# Patient Record
Sex: Male | Born: 1985 | Hispanic: Yes | Marital: Single | Smoking: Never smoker
Health system: Southern US, Community
[De-identification: ages and names within clinical notes are randomized; demographics above are authoritative.]

## PROBLEM LIST (undated history)

## (undated) DIAGNOSIS — R51 Headache: Secondary | ICD-10-CM

## (undated) DIAGNOSIS — R519 Headache, unspecified: Secondary | ICD-10-CM

---

## 2016-11-04 ENCOUNTER — Emergency Department: Payer: Self-pay

## 2016-11-04 ENCOUNTER — Emergency Department
Admission: EM | Admit: 2016-11-04 | Discharge: 2016-11-04 | Disposition: A | Discharge status: Home / Self Care | Payer: Self-pay | Attending: Emergency Medicine | Admitting: Emergency Medicine

## 2016-11-04 ENCOUNTER — Encounter: Payer: Self-pay | Admitting: Emergency Medicine

## 2016-11-04 DIAGNOSIS — R0602 Shortness of breath: Secondary | ICD-10-CM | POA: Insufficient documentation

## 2016-11-04 DIAGNOSIS — R202 Paresthesia of skin: Secondary | ICD-10-CM | POA: Insufficient documentation

## 2016-11-04 DIAGNOSIS — R1030 Lower abdominal pain, unspecified: Secondary | ICD-10-CM | POA: Insufficient documentation

## 2016-11-04 DIAGNOSIS — M545 Low back pain, unspecified: Secondary | ICD-10-CM

## 2016-11-04 DIAGNOSIS — G8929 Other chronic pain: Secondary | ICD-10-CM

## 2016-11-04 DIAGNOSIS — R42 Dizziness and giddiness: Secondary | ICD-10-CM | POA: Insufficient documentation

## 2016-11-04 HISTORY — DX: Headache, unspecified: R51.9

## 2016-11-04 HISTORY — DX: Headache: R51

## 2016-11-04 LAB — CBC WITH DIFFERENTIAL/PLATELET
BASOS ABS: 0 10*3/uL (ref 0–0.1)
Basophils Relative: 0 %
EOS PCT: 2 %
Eosinophils Absolute: 0.1 10*3/uL (ref 0–0.7)
HEMATOCRIT: 42.8 % (ref 40.0–52.0)
HEMOGLOBIN: 14.6 g/dL (ref 13.0–18.0)
LYMPHS ABS: 1.7 10*3/uL (ref 1.0–3.6)
LYMPHS PCT: 22 %
MCH: 33.1 pg (ref 26.0–34.0)
MCHC: 34.2 g/dL (ref 32.0–36.0)
MCV: 96.8 fL (ref 80.0–100.0)
MONO ABS: 0.5 10*3/uL (ref 0.2–1.0)
Monocytes Relative: 7 %
Neutro Abs: 5.3 10*3/uL (ref 1.4–6.5)
Neutrophils Relative %: 69 %
Platelets: 296 10*3/uL (ref 150–440)
RBC: 4.42 MIL/uL (ref 4.40–5.90)
RDW: 16.3 % — AB (ref 11.5–14.5)
WBC: 7.7 10*3/uL (ref 3.8–10.6)

## 2016-11-04 LAB — BASIC METABOLIC PANEL
Anion gap: 8 (ref 5–15)
BUN: 24 mg/dL — AB (ref 6–20)
CHLORIDE: 106 mmol/L (ref 101–111)
CO2: 28 mmol/L (ref 22–32)
Calcium: 9.8 mg/dL (ref 8.9–10.3)
Creatinine, Ser: 0.8 mg/dL (ref 0.61–1.24)
GFR calc Af Amer: >60 mL/min (ref >60)
GFR calc non Af Amer: >60 mL/min (ref >60)
GLUCOSE: 103 mg/dL — AB (ref 65–99)
POTASSIUM: 4.2 mmol/L (ref 3.5–5.1)
Sodium: 142 mmol/L (ref 135–145)

## 2016-11-04 LAB — TROPONIN I: Troponin I: <0.03 ng/mL (ref <0.03)

## 2016-11-04 MED ORDER — IOPAMIDOL (ISOVUE-370) INJECTION 76%
100.0000 mL | Freq: Once | INTRAVENOUS | Status: AC | PRN
  Administered 2016-11-04: 100 mL via INTRAVENOUS

## 2016-11-04 MED ORDER — NAPROXEN 500 MG PO TABS: 500.0000 mg | ORAL_TABLET | Freq: Two times a day (BID) | ORAL | 0 refills | Status: AC

## 2016-11-04 MED ORDER — KETOROLAC TROMETHAMINE 30 MG/ML IJ SOLN
15.0000 mg | INTRAMUSCULAR | Status: AC
  Administered 2016-11-04: 15 mg via INTRAVENOUS
  Filled 2016-11-04: qty 1

## 2016-11-04 NOTE — Discharge Instructions (Signed)
Your blood tests, EKG, xrays, and CT scans were all unremarkable.  Please follow up with a primary care doctor for continued monitoring of your symptoms. Take naproxen for the pain.  Results for orders placed or performed during the hospital encounter of 11/04/16  Basic metabolic panel  Result Value Ref Range   Sodium 142 135 - 145 mmol/L   Potassium 4.2 3.5 - 5.1 mmol/L   Chloride 106 101 - 111 mmol/L   CO2 28 22 - 32 mmol/L   Glucose, Bld 103 (H) 65 - 99 mg/dL   BUN 24 (H) 6 - 20 mg/dL   Creatinine, Ser 1.61 0.61 - 1.24 mg/dL   Calcium 9.8 8.9 - 09.6 mg/dL   GFR calc non Af Amer >60 >60 mL/min   GFR calc Af Amer >60 >60 mL/min   Anion gap 8 5 - 15  CBC with Differential  Result Value Ref Range   WBC 7.7 3.8 - 10.6 K/uL   RBC 4.42 4.40 - 5.90 MIL/uL   Hemoglobin 14.6 13.0 - 18.0 g/dL   HCT 04.5 40.9 - 81.1 %   MCV 96.8 80.0 - 100.0 fL   MCH 33.1 26.0 - 34.0 pg   MCHC 34.2 32.0 - 36.0 g/dL   RDW 91.4 (H) 78.2 - 95.6 %   Platelets 296 150 - 440 K/uL   Neutrophils Relative % 69 %   Neutro Abs 5.3 1.4 - 6.5 K/uL   Lymphocytes Relative 22 %   Lymphs Abs 1.7 1.0 - 3.6 K/uL   Monocytes Relative 7 %   Monocytes Absolute 0.5 0.2 - 1.0 K/uL   Eosinophils Relative 2 %   Eosinophils Absolute 0.1 0 - 0.7 K/uL   Basophils Relative 0 %   Basophils Absolute 0.0 0 - 0.1 K/uL  Troponin I  Result Value Ref Range   Troponin I <0.03 <0.03 ng/mL   Dg Chest 2 View  Result Date: 11/04/2016 CLINICAL DATA:  Blurred vision, dizziness, and chest pain for the past 2 hours with tight sensation in the stomach. EXAM: CHEST  2 VIEW COMPARISON:  None in PACs FINDINGS: The lungs are adequately inflated and clear. The heart and pulmonary vascularity are normal. The mediastinum is normal in width. There is no pleural effusion. The bony thorax is unremarkable. IMPRESSION: There is no active cardiopulmonary disease. Electronically Signed   By: David  [place] M.D.   On: 11/04/2016 08:49   Dg Lumbar Spine 2-3  Views  Result Date: 11/04/2016 CLINICAL DATA:  The patient sustained a fall playing soccer 2 months ago with onset of hip and leg numbness 1 week after the injury. EXAM: LUMBAR SPINE - 2-3 VIEW COMPARISON:  None in PACs FINDINGS: The lumbar vertebral bodies are preserved in height. The pedicles and transverse processes are intact. There is mild disc space narrowing at L4-5. There are bilateral pars defects at L5 without spondylolisthesis. The observed portions of the sacrum are normal. IMPRESSION: Mild disc space narrowing at L4-5. 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 No acute compression fracture. Electronically Signed   By: David  [place] M.D.   On: 11/04/2016 08:50   Ct Angio Chest Aorta W And/or Wo Contrast  Result Date: 11/04/2016 CLINICAL DATA:  Dizziness at work yesterday. Short of breath. Chest pain. Back pain. Tingling and numbing in lower legs for 3 weeks. EXAM: CT ANGIOGRAPHY CHEST, ABDOMEN AND PELVIS TECHNIQUE: Multidetector CT imaging through the chest, abdomen and pelvis was performed using the standard protocol during bolus administration of intravenous contrast.  Multiplanar reconstructed images and MIPs were obtained and reviewed to evaluate the vascular anatomy. CONTRAST:  100 cc Isovue 370 COMPARISON:  None. FINDINGS: CTA CHEST FINDINGS Cardiovascular: There is no evidence of aortic aneurysm, aortic dissection, or intramural hematoma. Precontrast images were included in this study for better accuracy of hemorrhage. The great vessels are patent within the confines of the examination. Vertebral arteries are also patent. There is no obvious acute pulmonary thromboembolism. The heart and chambers are grossly normal in size and configuration. There is no significant coronary artery calcification. There are no atherosclerotic calcifications of the aortic arch or descending thoracic aorta. Mediastinum/Nodes: No abnormal adenopathy. Visualized thyroid is unremarkable. No evidence  of mediastinal hemorrhage. There is mild thickening of the wall of the esophagus throughout its course which may be due to decompression. Inflammatory change cannot be excluded. Lungs/Pleura: No pneumothorax or pleural effusion. Dependent ground-glass volume loss in the lungs bilaterally. Musculoskeletal: No evidence of acute rib fracture or vertebral compression deformity. Review of the MIP images confirms the above findings. CTA ABDOMEN AND PELVIS FINDINGS VASCULAR Aorta: Abdominal aorta is nonaneurysmal and patent. No evidence of dissection. Celiac: Patent. SMA: Patent. Renals: Single renal arteries are patent. IMA: Patent. Inflow: Bilateral common iliac, external iliac, and internal iliac arteries are patent. No evidence of dissection or occlusion. Review of the MIP images confirms the above findings. NON-VASCULAR Hepatobiliary: Diffuse hepatic steatosis. Gallbladder is unremarkable. Pancreas: Unremarkable Spleen: Unremarkable. Adrenals/Urinary Tract: Kidneys and adrenal glands are unremarkable. Bladder is within normal limits. Stomach/Bowel: Stomach and duodenal C-loop are within normal limits. Normal appendix. No obvious mass in the colon. No evidence of small-bowel obstruction. Lymphatic: No abnormal retroperitoneal adenopathy. Reproductive: Prostate is within normal limits. Other: No free-fluid.  Left inguinal hernia contains adipose tissue. Musculoskeletal: Bilateral L5 pars defects. Minimal anterolisthesis L5 upon S1. No vertebral compression deformity. Review of the MIP images confirms the above findings. IMPRESSION: Vascular: No evidence of aortic aneurysm or dissection. Nonvascular: Diffuse hepatic steatosis. Bilateral L5 pars defects. Electronically Signed   By: Jolaine ClickArthur  Hoss M.D.   On: 11/04/2016 10:15   Ct Angio Abd/pel W And/or Wo Contrast  Result Date: 11/04/2016 CLINICAL DATA:  Dizziness at work yesterday. Short of breath. Chest pain. Back pain. Tingling and numbing in lower legs for 3 weeks.  EXAM: CT ANGIOGRAPHY CHEST, ABDOMEN AND PELVIS TECHNIQUE: Multidetector CT imaging through the chest, abdomen and pelvis was performed using the standard protocol during bolus administration of intravenous contrast. Multiplanar reconstructed images and MIPs were obtained and reviewed to evaluate the vascular anatomy. CONTRAST:  100 cc Isovue 370 COMPARISON:  None. FINDINGS: CTA CHEST FINDINGS Cardiovascular: There is no evidence of aortic aneurysm, aortic dissection, or intramural hematoma. Precontrast images were included in this study for better accuracy of hemorrhage. The great vessels are patent within the confines of the examination. Vertebral arteries are also patent. There is no obvious acute pulmonary thromboembolism. The heart and chambers are grossly normal in size and configuration. There is no significant coronary artery calcification. There are no atherosclerotic calcifications of the aortic arch or descending thoracic aorta. Mediastinum/Nodes: No abnormal adenopathy. Visualized thyroid is unremarkable. No evidence of mediastinal hemorrhage. There is mild thickening of the wall of the esophagus throughout its course which may be due to decompression. Inflammatory change cannot be excluded. Lungs/Pleura: No pneumothorax or pleural effusion. Dependent ground-glass volume loss in the lungs bilaterally. Musculoskeletal: No evidence of acute rib fracture or vertebral compression deformity. Review of the MIP images confirms the above findings. CTA  ABDOMEN AND PELVIS FINDINGS VASCULAR Aorta: Abdominal aorta is nonaneurysmal and patent. No evidence of dissection. Celiac: Patent. SMA: Patent. Renals: Single renal arteries are patent. IMA: Patent. Inflow: Bilateral common iliac, external iliac, and internal iliac arteries are patent. No evidence of dissection or occlusion. Review of the MIP images confirms the above findings. NON-VASCULAR Hepatobiliary: Diffuse hepatic steatosis. Gallbladder is unremarkable.  Pancreas: Unremarkable Spleen: Unremarkable. Adrenals/Urinary Tract: Kidneys and adrenal glands are unremarkable. Bladder is within normal limits. Stomach/Bowel: Stomach and duodenal C-loop are within normal limits. Normal appendix. No obvious mass in the colon. No evidence of small-bowel obstruction. Lymphatic: No abnormal retroperitoneal adenopathy. Reproductive: Prostate is within normal limits. Other: No free-fluid.  Left inguinal hernia contains adipose tissue. Musculoskeletal: Bilateral L5 pars defects. Minimal anterolisthesis L5 upon S1. No vertebral compression deformity. Review of the MIP images confirms the above findings. IMPRESSION: Vascular: No evidence of aortic aneurysm or dissection. Nonvascular: Diffuse hepatic steatosis. Bilateral L5 pars defects. Electronically Signed   By: Jolaine Click M.D.   On: 11/04/2016 10:15

## 2016-11-04 NOTE — ED Notes (Signed)
ED Provider at bedside.Pt is A/O. Interpreter is present as well. Pt is complaints of numbness and "insides cramping". Pt states numbness in the legs causes him to fall (more than a mth ago) and trouble walking. Pt states pain in stomach has been present for 1 mth and legs numbness for 2 mths. Pt denies chest pain at this time. Pt denies medical history.

## 2016-11-04 NOTE — ED Triage Notes (Addendum)
Triage completed via Spanish interpreter, Pt reports dizziness at work yesterday with c/o shortness of breath and chest pain.  Pt states the chest pain is on the right side. Pt also c/o back pain in the mid-lower back. Pt c/o tingling and numbing in lower legs x 3 weeks.  Pt denies any problems urinating or moving bowels.    Chest pain 6/10 pain, back pain 9/10 pain.

## 2016-11-04 NOTE — ED Notes (Signed)
Patient transported to CT 

## 2016-11-04 NOTE — ED Notes (Signed)
Patient transported to X-ray 

## 2016-11-04 NOTE — ED Provider Notes (Signed)
[facility] Emergency Department Provider Note  ____________________________________________  Time seen: Approximately 8:58 AM  I have reviewed the triage vital signs and the nursing notes.   HISTORY  Chief Complaint Chest Pain; Shortness of Breath; and Back Pain  Level 5 Caveat: Portions of the History and Physical are unable to be obtained due to patient being a poor historian encounter was completed with in person Spanish interpreter at bedside  HPI Tony Dougherty is a 31 y.o. male reports multiple complaints. He has a hard time providing precise timelines of these complaints or relating them to each other. I gather that he is having chronic bilateral leg paresthesias that started about 2 months ago, first in his feeding gradually progressing up both legs. He feels like they are numb, as in he actually cannot feel them.  About 6 weeks ago he fell in a park, and has had right lower back pain since then. That back pain is nonradiating, no aggravating or alleviating factors, moderate intensity, aching, constant.  The patient is gone back and forth as to whether or not he is having chest pain. He told triage she was, on my direct interview he denied ever having any chest pain, and to the radiology tech he reported that he is currently having chest pain. Per the triage note he had dizziness and shortness of breath as well related to the pain.     Past Medical History:  Diagnosis Date  . Headache      There are no active problems to display for this patient.    History reviewed. No pertinent surgical history.   Prior to Admission medications   Medication Sig Start Date End Date Taking? Authorizing Provider  naproxen (NAPROSYN) 500 MG tablet Take 1 tablet (500 mg total) by mouth 2 (two) times daily with a meal. 11/04/16   Sharman CheekStafford, Shown Dissinger, MD  none   Allergies Patient has no known allergies.   History reviewed. No pertinent family  history.  Social History Social History  Substance Use Topics  . Smoking status: Never Smoker  . Smokeless tobacco: Never Used  . Alcohol use No    Review of Systems  Constitutional:   No fever or chills.  ENT:   No sore throat. No rhinorrhea. Cardiovascular:   positive as above without syncope. Respiratory:   positive shortness of breath without cough. Gastrointestinal:  positive diffuse lower abdominal pain without vomiting diarrhea or constipation.  Musculoskeletal:  positive right-sided back pain All other systems reviewed and are negative except as documented above in ROS and HPI.  ____________________________________________   PHYSICAL EXAM:  VITAL SIGNS: ED Triage Vitals  Enc Vitals Group     BP 11/04/16 0724 130/75     Pulse Rate 11/04/16 0724 81     Resp 11/04/16 0724 18     Temp 11/04/16 0724 98 F (36.7 C)     Temp Source 11/04/16 0724 Oral     SpO2 11/04/16 0724 98 %     Weight 11/04/16 0734 250 lb (113.4 kg)     Height 11/04/16 0734 5' 8.5" (1.74 m)     Head Circumference --      Peak Flow --      Pain Score 11/04/16 0734 9     Pain Loc --      Pain Edu? --      Excl. in GC? --     Vital signs reviewed, nursing assessments reviewed.   Constitutional:   Alert and oriented.  Well appearing and in no distress. Eyes:   No scleral icterus.  EOMI. No nystagmus. No conjunctival pallor. PERRL. ENT   Head:   Normocephalic and atraumatic.   Nose:   No congestion/rhinnorhea.    Mouth/Throat:   MMM, no pharyngeal erythema. No peritonsillar mass.    Neck:   No meningismus. Full ROM. Hematological/Lymphatic/Immunilogical:   No cervical lymphadenopathy. Cardiovascular:   RRR. Symmetric bilateral radial and DP pulses.  No murmurs.  Respiratory:   Normal respiratory effort without tachypnea/retractions. Breath sounds are clear and equal bilaterally. No wheezes/rales/rhonchi. Gastrointestinal:   Soft and nontender. Non distended. There is no CVA  tenderness.  No rebound, rigidity, or guarding. Genitourinary:   deferred Musculoskeletal:   Normal range of motion in all extremities. No joint effusions.  No lower extremity tenderness.  No edema.no midline spinal tenderness. No worsening of pain with rotation of the thorax. Straight leg raise negative bilaterally. Neurologic:   Normal speech and language.  Motor grossly intact. normal gait Normal plantarflexion and dorsiflexion and hip flexion. Sensation intact in bilateral lower extremities down to the toes with sharp stimulus.  Plantar reflexes downgoing bilaterally No gross focal neurologic deficits are appreciated.  Skin:    Skin is warm, dry and intact. No rash noted.  No petechiae, purpura, or bullae.  ____________________________________________    LABS (pertinent positives/negatives) (all labs ordered are listed, but only abnormal results are displayed) Labs Reviewed  BASIC METABOLIC PANEL - Abnormal; Notable for the following:       Result Value   Glucose, Bld 103 (*)    BUN 24 (*)    All other components within normal limits  CBC WITH DIFFERENTIAL/PLATELET - Abnormal; Notable for the following:    RDW 16.3 (*)    All other components within normal limits  TROPONIN I   ____________________________________________   EKG  interpreted by me  Date: 11/04/2016  Rate: 95  Rhythm: normal sinus rhythm  QRS Axis: normal  Intervals: normal  ST/T Wave abnormalities: normal  Conduction Disutrbances: none  Narrative Interpretation: unremarkable      ____________________________________________    RADIOLOGY  Dg Chest 2 View  Result Date: 11/04/2016 CLINICAL DATA:  Blurred vision, dizziness, and chest pain for the past 2 hours with tight sensation in the stomach. EXAM: CHEST  2 VIEW COMPARISON:  None in PACs FINDINGS: The lungs are adequately inflated and clear. The heart and pulmonary vascularity are normal. The mediastinum is normal in width. There is no pleural  effusion. The bony thorax is unremarkable. IMPRESSION: There is no active cardiopulmonary disease. Electronically Signed   By: David  [place] M.D.   On: 11/04/2016 08:49   Dg Lumbar Spine 2-3 Views  Result Date: 11/04/2016 CLINICAL DATA:  The patient sustained a fall playing soccer 2 months ago with onset of hip and leg numbness 1 week after the injury. EXAM: LUMBAR SPINE - 2-3 VIEW COMPARISON:  None in PACs FINDINGS: The lumbar vertebral bodies are preserved in height. The pedicles and transverse processes are intact. There is mild disc space narrowing at L4-5. There are bilateral pars defects at L5 without spondylolisthesis. The observed portions of the sacrum are normal. IMPRESSION: Mild disc space narrowing at L4-5. 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 No acute compression fracture. Electronically Signed   By: David  [place] M.D.   On: 11/04/2016 08:50   Ct Angio Chest Aorta W And/or Wo Contrast  Result Date: 11/04/2016 CLINICAL DATA:  Dizziness at work yesterday. Short of breath. Chest pain.  Back pain. Tingling and numbing in lower legs for 3 weeks. EXAM: CT ANGIOGRAPHY CHEST, ABDOMEN AND PELVIS TECHNIQUE: Multidetector CT imaging through the chest, abdomen and pelvis was performed using the standard protocol during bolus administration of intravenous contrast. Multiplanar reconstructed images and MIPs were obtained and reviewed to evaluate the vascular anatomy. CONTRAST:  100 cc Isovue 370 COMPARISON:  None. FINDINGS: CTA CHEST FINDINGS Cardiovascular: There is no evidence of aortic aneurysm, aortic dissection, or intramural hematoma. Precontrast images were included in this study for better accuracy of hemorrhage. The great vessels are patent within the confines of the examination. Vertebral arteries are also patent. There is no obvious acute pulmonary thromboembolism. The heart and chambers are grossly normal in size and configuration. There is no significant coronary artery  calcification. There are no atherosclerotic calcifications of the aortic arch or descending thoracic aorta. Mediastinum/Nodes: No abnormal adenopathy. Visualized thyroid is unremarkable. No evidence of mediastinal hemorrhage. There is mild thickening of the wall of the esophagus throughout its course which may be due to decompression. Inflammatory change cannot be excluded. Lungs/Pleura: No pneumothorax or pleural effusion. Dependent ground-glass volume loss in the lungs bilaterally. Musculoskeletal: No evidence of acute rib fracture or vertebral compression deformity. Review of the MIP images confirms the above findings. CTA ABDOMEN AND PELVIS FINDINGS VASCULAR Aorta: Abdominal aorta is nonaneurysmal and patent. No evidence of dissection. Celiac: Patent. SMA: Patent. Renals: Single renal arteries are patent. IMA: Patent. Inflow: Bilateral common iliac, external iliac, and internal iliac arteries are patent. No evidence of dissection or occlusion. Review of the MIP images confirms the above findings. NON-VASCULAR Hepatobiliary: Diffuse hepatic steatosis. Gallbladder is unremarkable. Pancreas: Unremarkable Spleen: Unremarkable. Adrenals/Urinary Tract: Kidneys and adrenal glands are unremarkable. Bladder is within normal limits. Stomach/Bowel: Stomach and duodenal C-loop are within normal limits. Normal appendix. No obvious mass in the colon. No evidence of small-bowel obstruction. Lymphatic: No abnormal retroperitoneal adenopathy. Reproductive: Prostate is within normal limits. Other: No free-fluid.  Left inguinal hernia contains adipose tissue. Musculoskeletal: Bilateral L5 pars defects. Minimal anterolisthesis L5 upon S1. No vertebral compression deformity. Review of the MIP images confirms the above findings. IMPRESSION: Vascular: No evidence of aortic aneurysm or dissection. Nonvascular: Diffuse hepatic steatosis. Bilateral L5 pars defects. Electronically Signed   By: Jolaine Click M.D.   On: 11/04/2016 10:15    Ct Angio Abd/pel W And/or Wo Contrast  Result Date: 11/04/2016 CLINICAL DATA:  Dizziness at work yesterday. Short of breath. Chest pain. Back pain. Tingling and numbing in lower legs for 3 weeks. EXAM: CT ANGIOGRAPHY CHEST, ABDOMEN AND PELVIS TECHNIQUE: Multidetector CT imaging through the chest, abdomen and pelvis was performed using the standard protocol during bolus administration of intravenous contrast. Multiplanar reconstructed images and MIPs were obtained and reviewed to evaluate the vascular anatomy. CONTRAST:  100 cc Isovue 370 COMPARISON:  None. FINDINGS: CTA CHEST FINDINGS Cardiovascular: There is no evidence of aortic aneurysm, aortic dissection, or intramural hematoma. Precontrast images were included in this study for better accuracy of hemorrhage. The great vessels are patent within the confines of the examination. Vertebral arteries are also patent. There is no obvious acute pulmonary thromboembolism. The heart and chambers are grossly normal in size and configuration. There is no significant coronary artery calcification. There are no atherosclerotic calcifications of the aortic arch or descending thoracic aorta. Mediastinum/Nodes: No abnormal adenopathy. Visualized thyroid is unremarkable. No evidence of mediastinal hemorrhage. There is mild thickening of the wall of the esophagus throughout its course which may be due to decompression.  Inflammatory change cannot be excluded. Lungs/Pleura: No pneumothorax or pleural effusion. Dependent ground-glass volume loss in the lungs bilaterally. Musculoskeletal: No evidence of acute rib fracture or vertebral compression deformity. Review of the MIP images confirms the above findings. CTA ABDOMEN AND PELVIS FINDINGS VASCULAR Aorta: Abdominal aorta is nonaneurysmal and patent. No evidence of dissection. Celiac: Patent. SMA: Patent. Renals: Single renal arteries are patent. IMA: Patent. Inflow: Bilateral common iliac, external iliac, and internal iliac  arteries are patent. No evidence of dissection or occlusion. Review of the MIP images confirms the above findings. NON-VASCULAR Hepatobiliary: Diffuse hepatic steatosis. Gallbladder is unremarkable. Pancreas: Unremarkable Spleen: Unremarkable. Adrenals/Urinary Tract: Kidneys and adrenal glands are unremarkable. Bladder is within normal limits. Stomach/Bowel: Stomach and duodenal C-loop are within normal limits. Normal appendix. No obvious mass in the colon. No evidence of small-bowel obstruction. Lymphatic: No abnormal retroperitoneal adenopathy. Reproductive: Prostate is within normal limits. Other: No free-fluid.  Left inguinal hernia contains adipose tissue. Musculoskeletal: Bilateral L5 pars defects. Minimal anterolisthesis L5 upon S1. No vertebral compression deformity. Review of the MIP images confirms the above findings. IMPRESSION: Vascular: No evidence of aortic aneurysm or dissection. Nonvascular: Diffuse hepatic steatosis. Bilateral L5 pars defects. Electronically Signed   By: Jolaine Click M.D.   On: 11/04/2016 10:15    ____________________________________________   PROCEDURES Procedures  ____________________________________________   DIFFERENTIAL DIAGNOSIS  radiculopathy, AAA, aortic dissection, lumbar strain, nutritional deficiency  CLINICAL IMPRESSION / ASSESSMENT AND PLAN / ED COURSE  Pertinent labs & imaging results that were available during my care of the patient were reviewed by me and considered in my medical decision making (see chart for details).   patient presents with constellation of symptoms, difficulty providing a clear coherent history. Initial labs and x-rays are unrevealing for any clear cause of his symptoms. His vital signs are normal, but because of pattern emerges that could be due to a vascular catastrophe, I will obtain a CT angiogram of the chest abdomen pelvis to rule out life-threatening illness.   ----------------------------------------- 10:58 AM on  11/04/2016 -----------------------------------------  Extensive workup including labs x-rays and CT angiogram of the chest abdomen pelvis all unremarkable. Have the patient take naproxen and follow-up with primary care for further monitoring of symptoms. Low suspicion of ACS PE or pericarditis. Presentation is not consistent with stroke or other CNS process.     ____________________________________________   FINAL CLINICAL IMPRESSION(S) / ED DIAGNOSES    Final diagnoses:  Chronic bilateral low back pain without sciatica  Paresthesia      New Prescriptions   NAPROXEN (NAPROSYN) 500 MG TABLET    Take 1 tablet (500 mg total) by mouth 2 (two) times daily with a meal.     Portions of this note were generated with dragon dictation software. Dictation errors may occur despite best attempts at proofreading.    Sharman Cheek, MD 11/04/16 1059

## 2017-01-05 ENCOUNTER — Encounter: Payer: Self-pay | Admitting: Emergency Medicine

## 2017-01-05 ENCOUNTER — Emergency Department
Admission: EM | Admit: 2017-01-05 | Discharge: 2017-01-05 | Disposition: A | Discharge status: Home / Self Care | Payer: Self-pay | Attending: Emergency Medicine | Admitting: Emergency Medicine

## 2017-01-05 ENCOUNTER — Other Ambulatory Visit: Payer: Self-pay

## 2017-01-05 DIAGNOSIS — G629 Polyneuropathy, unspecified: Secondary | ICD-10-CM | POA: Insufficient documentation

## 2017-01-05 DIAGNOSIS — R42 Dizziness and giddiness: Secondary | ICD-10-CM | POA: Insufficient documentation

## 2017-01-05 DIAGNOSIS — R51 Headache: Secondary | ICD-10-CM | POA: Insufficient documentation

## 2017-01-05 DIAGNOSIS — R519 Headache, unspecified: Secondary | ICD-10-CM

## 2017-01-05 NOTE — ED Triage Notes (Addendum)
Pt presents to ED with progressively worsening weakness, headache behind his eyes, and fatigue with shortness of breath over the past 2 months. Unsure of cause. Worse when he goes to work at [employer]. 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 Pt was told by his employer he may have been exposed to a toxic gas and might need to have his blood drawn.

## 2017-01-05 NOTE — ED Provider Notes (Addendum)
[facility] Emergency Department Provider Note  ____________________________________________   First MD Initiated Contact with Patient 01/05/17 2106     (approximate)  I have reviewed the triage vital signs and the nursing notes.   HISTORY  Chief Complaint Weakness   HPI Tony Dougherty is a 31 y.o. male without any chronic medical problems was presented to the emergency department today complaining of months of headache, dizziness, eye irritation as well as numbness to his hands, legs and weakness to the legs.  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  He says that his symptoms are worse at work and then improve when he comes home.  He says there are also 3 other people at his job with similar symptoms.  The patient has been to multiple emergency departments and had blood work done without any significant abnormalities revealed.  He is presenting back to the emergency department here because of worsening symptoms.  Past Medical History:  Diagnosis Date  . Headache     There are no active problems to display for this patient.   History reviewed. No pertinent surgical history.  Prior to Admission medications   Medication Sig Start Date End Date Taking? Authorizing Provider  naproxen (NAPROSYN) 500 MG tablet Take 1 tablet (500 mg total) by mouth 2 (two) times daily with a meal. 11/04/16   Sharman CheekStafford, Phillip, MD    Allergies Patient has no allergy information on record.  No family history on file.  Social History Social History   Tobacco Use  . Smoking status: Never Smoker  . Smokeless tobacco: Never Used  Substance Use Topics  . Alcohol use: No  . Drug use: No    Review of Systems  Constitutional: No fever/chills Eyes: No visual changes. ENT: No sore throat. Cardiovascular: Denies chest pain. Respiratory: Denies shortness of  breath. Gastrointestinal: No abdominal pain.  No nausea, no vomiting.  No diarrhea.  No constipation. Genitourinary: Negative for dysuria. Musculoskeletal: Negative for back pain. Skin: Negative for rash. Neurological: Negative for focal weakness or numbness.   ____________________________________________   PHYSICAL EXAM:  VITAL SIGNS: ED Triage Vitals  Enc Vitals Group     BP 01/05/17 2042 (!) 147/87     Pulse Rate 01/05/17 2042 74     Resp 01/05/17 2042 20     Temp 01/05/17 2042 99.3 F (37.4 C)     Temp Source 01/05/17 2042 Oral     SpO2 01/05/17 2042 96 %     Weight 01/05/17 2043 238 lb (108 kg)     Height 01/05/17 2043 5\' 9"  (1.753 m)     Head Circumference --      Peak Flow --      Pain Score 01/05/17 2042 7     Pain Loc --      Pain Edu? --      Excl. in GC? --     Constitutional: Alert and oriented. Well appearing and in no acute distress. Eyes: Conjunctivae are normal.  Extraocular muscles are intact. Head: Atraumatic. Nose: No congestion/rhinnorhea. Mouth/Throat: Mucous membranes are moist.  Neck: No stridor.   Cardiovascular: Normal rate, regular rhythm. Grossly normal heart sounds.  Respiratory: Normal respiratory effort.  No retractions. Lungs CTAB. Gastrointestinal: Soft and nontender. No distention.  Musculoskeletal: No lower extremity tenderness nor edema.  No joint effusions. Neurologic:  Normal speech and language.  Patient does walk with  a slightly shuffling gait.  However, when he is sitting he appears to move all extremities equally.  He is sensate to light touch to his bilateral upper as well as lower extremities. Skin:  Skin is warm, dry and intact. No rash noted. Psychiatric: Mood and affect are normal. Speech and behavior are normal.  ____________________________________________   LABS (all labs ordered are listed, but only abnormal results are displayed)  Labs Reviewed  CBC WITH DIFFERENTIAL/PLATELET  COMPREHENSIVE METABOLIC PANEL   TROPONIN I  URINALYSIS, COMPLETE (UACMP) WITH MICROSCOPIC   ____________________________________________  EKG  ED ECG REPORT I, Arelia Longestavid M Delroy Ordway, the attending physician, personally viewed and interpreted this ECG.   Date: 01/05/2017  EKG Time: 2029  Rate: 79  Rhythm: normal sinus rhythm  Axis: Normal  Intervals:none  ST&T Change: No ST segment elevation or depression.  No abnormal T wave inversion.  ____________________________________________  RADIOLOGY   ____________________________________________   PROCEDURES  Procedure(s) performed:   Procedures  Critical Care performed:   ____________________________________________   INITIAL IMPRESSION / ASSESSMENT AND PLAN / ED COURSE  Pertinent labs & imaging results that were available during my care of the patient were reviewed by me and considered in my medical decision making (see chart for details).  DDX: Neuropathy, occupational exposure, occupational toxicity, nitrous toxicity, divosan toxicity As part of my medical decision making, I reviewed the following data within the electronic MEDICAL RECORD NUMBER Notes from prior ED visits     I discussed repeating the patient's lab work today.  However, the patient says that he would rather follow-up with poison control to be directed towards specialist for more focused testing.  Specifically, the patient's wife mentioned "heavy metals" however, I am a bit confused by this since the patient does not work with any obvious heavy metals.  Interpreter services used for interaction with the patient.  Patient refusing all blood work today. ____________________________________________   FINAL CLINICAL IMPRESSION(S) / ED DIAGNOSES  Headache, dizziness, neuropathy    NEW MEDICATIONS STARTED DURING THIS VISIT:  This SmartLink is deprecated. Use AVSMEDLIST instead to display the medication list for a patient.   Note:  This document was prepared using Dragon voice recognition  software and may include unintentional dictation errors.     Myrna BlazerSchaevitz, Felicitas Sine Matthew, MD 01/05/17 2206    Myrna BlazerSchaevitz, Rori Goar Matthew, MD 01/05/17 2209

## 2017-01-05 NOTE — ED Notes (Signed)
After lengthy discussion with pt, MD, interpreter regarding c/o and limitations here, pt decided to follow up outpatient with poison control d/t being seen for same in other ER prior to today

## 2017-01-06 ENCOUNTER — Emergency Department: Payer: Self-pay

## 2017-01-06 ENCOUNTER — Emergency Department
Admission: EM | Admit: 2017-01-06 | Discharge: 2017-01-06 | Disposition: A | Discharge status: Home / Self Care | Payer: Self-pay | Attending: Emergency Medicine | Admitting: Emergency Medicine

## 2017-01-06 ENCOUNTER — Encounter: Payer: Self-pay | Admitting: *Deleted

## 2017-01-06 ENCOUNTER — Other Ambulatory Visit: Payer: Self-pay

## 2017-01-06 DIAGNOSIS — R110 Nausea: Secondary | ICD-10-CM | POA: Insufficient documentation

## 2017-01-06 DIAGNOSIS — R079 Chest pain, unspecified: Secondary | ICD-10-CM | POA: Insufficient documentation

## 2017-01-06 DIAGNOSIS — R519 Headache, unspecified: Secondary | ICD-10-CM

## 2017-01-06 DIAGNOSIS — R51 Headache: Secondary | ICD-10-CM | POA: Insufficient documentation

## 2017-01-06 LAB — TROPONIN I

## 2017-01-06 LAB — CBC
HCT: 44.5 % (ref 40.0–52.0)
HEMOGLOBIN: 15.2 g/dL (ref 13.0–18.0)
MCH: 33 pg (ref 26.0–34.0)
MCHC: 34.1 g/dL (ref 32.0–36.0)
MCV: 96.6 fL (ref 80.0–100.0)
PLATELETS: 285 10*3/uL (ref 150–440)
RBC: 4.61 MIL/uL (ref 4.40–5.90)
RDW: 14.6 % — ABNORMAL HIGH (ref 11.5–14.5)
WBC: 8.5 10*3/uL (ref 3.8–10.6)

## 2017-01-06 LAB — BASIC METABOLIC PANEL
ANION GAP: 4 — AB (ref 5–15)
BUN: 22 mg/dL — ABNORMAL HIGH (ref 6–20)
CALCIUM: 9.3 mg/dL (ref 8.9–10.3)
CO2: 26 mmol/L (ref 22–32)
CREATININE: 0.96 mg/dL (ref 0.61–1.24)
Chloride: 109 mmol/L (ref 101–111)
Glucose, Bld: 104 mg/dL — ABNORMAL HIGH (ref 65–99)
Potassium: 3.7 mmol/L (ref 3.5–5.1)
SODIUM: 139 mmol/L (ref 135–145)

## 2017-01-06 MED ORDER — ONDANSETRON 4 MG PO TBDP: ORAL_TABLET | ORAL | 0 refills | Status: AC

## 2017-01-06 NOTE — Discharge Instructions (Signed)
We do not believe that your symptoms are caused by a chemical exposure.  However we did send a heavy metals test, but the results will not be back for probably at least a week.  You will be contacted if the results are abnormal, but if you do not receive a call back, it is because all of the results were normal and you do not need to worry about the exposure.  We encourage you to follow-up with your regular doctor to discuss your ongoing symptoms, but all of your results are within normal limits today and there is no evidence of any acute or emergent medical condition.

## 2017-01-06 NOTE — ED Notes (Signed)
Interpreter at bedside.

## 2017-01-06 NOTE — ED Triage Notes (Addendum)
Pt back to ED at this time reporting generalized chest pain. Pt was seen in ED last night for weakness and headache. Pt reports he is having SOB and increased WOB with a cough. No fevers at home. Weakness continues with reported diaphoresis today.   Pt also reports he continues to have a headache at this time.   Pt report he believes all these symptoms are due to a chemical exposure that occurred on Saturday. Pt reports he believes the chemical was nitrous oxide. Pt reports he is scared because another coworker was hospitalized for exposure. diversine MH was another chemical pt reports having been exposed to.

## 2017-01-06 NOTE — ED Provider Notes (Signed)
[facility] Emergency Department Provider Note  ____________________________________________   First MD Initiated Contact with Patient 01/06/17 1931     (approximate)  I have reviewed the triage vital signs and the nursing notes.   The patient and/or family speak(s) Spanish.  They understand they have the right to the use of a hospital interpreter, however at this time they prefer to speak directly with me in Spanish.  They know that they can ask for an interpreter at any time.    HISTORY  Chief Complaint Chest Pain    HPI Tony Dougherty is a 31 y.o. male who is generally well but presents for evaluation of a variety of complaints.  He believes these are all secondary to chemical exposures he had at work more than a month ago.  [facility] and then he has had 3 visits to emergency departments within the [facility], most recently at [facility] yesterday.  He reports a variety of symptoms including smelling chemicals when he breathes through his nose and a sensation of congestion, intermittent nausea, intermittent headache, and most recently with some generalized chest pain today.  Yesterday he reported generalized weakness and a headache.  Occasionally has some shortness of breath and a cough.  He denies vomiting, fever/chills, abdominal pain, and dysuria.  He is very concerned about the chemical exposures and reports that he was exposed to divosan and nitrous oxide.  He also reports that one or more  people that work with him were hospitalized but is not certain why.  Past Medical History:  Diagnosis Date  . Headache     There are no active problems to display for this patient.   History reviewed. No pertinent surgical history.  Prior to Admission medications   Medication Sig Start Date End Date Taking? Authorizing Provider  naproxen (NAPROSYN) 500 MG tablet Take 1 tablet (500 mg total) by mouth 2  (two) times daily with a meal. Patient not taking: Reported on 01/06/2017 11/04/16   Sharman CheekStafford, Phillip, MD  ondansetron (ZOFRAN ODT) 4 MG disintegrating tablet Allow 1-2 tablets to dissolve in your mouth every 8 hours as needed for nausea/vomiting 01/06/17   Loleta RoseForbach, Izela Altier, MD    Allergies Patient has no allergy information on record.  History reviewed. No pertinent family history.  Social History Social History   Tobacco Use  . Smoking status: Never Smoker  . Smokeless tobacco: Never Used  Substance Use Topics  . Alcohol use: No  . Drug use: No    Review of Systems Constitutional: No fever/chills. Generalized weakness. Eyes: No visual changes. ENT: No sore throat. Congestion.  Smells "chemicals" when he breathes. Cardiovascular: Denies chest pain. Respiratory: Denies shortness of breath. Gastrointestinal: No abdominal pain.  No nausea, no vomiting.  No diarrhea.  No constipation. Genitourinary: Negative for dysuria. Musculoskeletal: Negative for neck pain.  Negative for back pain. Integumentary: Negative for rash. Neurological: Negative for headaches, focal weakness or numbness.   ____________________________________________   PHYSICAL EXAM:  VITAL SIGNS: ED Triage Vitals  Enc Vitals Group     BP 01/06/17 1716 (!) 145/69     Pulse Rate 01/06/17 1716 74     Resp 01/06/17 1716 18     Temp 01/06/17 1716 99 F (37.2 C)     Temp Source 01/06/17 1716 Oral     SpO2 01/06/17 1716 97 %     Weight 01/06/17 1702 108 kg (238 lb)  Height 01/06/17 1702 1.753 m (5\' 9" )     Head Circumference --      Peak Flow --      Pain Score 01/06/17 1702 9     Pain Loc --      Pain Edu? --      Excl. in GC? --     Constitutional: Alert and oriented. Well appearing and in no acute distress. Eyes: Conjunctivae are normal.  Head: Atraumatic. Nose: No congestion/rhinnorhea.  Clear nasal passages upon visualization with nasal speculum Mouth/Throat: Mucous membranes are moist. Neck:  No stridor.  No meningeal signs.   Cardiovascular: Normal rate, regular rhythm. Good peripheral circulation. Grossly normal heart sounds. Respiratory: Normal respiratory effort.  No retractions. Lungs CTAB. Gastrointestinal: Soft and nontender. No distention.  Musculoskeletal: No lower extremity tenderness nor edema. No gross deformities of extremities. Neurologic:  Normal speech and language. No gross focal neurologic deficits are appreciated.  Skin:  Skin is warm, dry and intact. No rash noted. Psychiatric: Mood and affect are normal. Speech and behavior are normal.  ____________________________________________   LABS (all labs ordered are listed, but only abnormal results are displayed)  Labs Reviewed  BASIC METABOLIC PANEL - Abnormal; Notable for the following components:      Result Value   Glucose, Bld 104 (*)    BUN 22 (*)    Anion gap 4 (*)    All other components within normal limits  CBC - Abnormal; Notable for the following components:   RDW 14.6 (*)    All other components within normal limits  TROPONIN I  HEAVY METALS, BLOOD   ____________________________________________  EKG  ED ECG REPORT I, Loleta Rose, the attending physician, personally viewed and interpreted this ECG.  Date: 01/06/2017 EKG Time: 16:56 Rate: 80 Rhythm: normal sinus rhythm QRS Axis: normal Intervals: normal ST/T Wave abnormalities: normal Narrative Interpretation: no evidence of acute ischemia  ____________________________________________  RADIOLOGY   Dg Chest 2 View  Result Date: 01/06/2017 CLINICAL DATA:  31 y/o  M; chest pain. EXAM: CHEST  2 VIEW COMPARISON:  11/04/2016 chest radiograph. FINDINGS: Stable heart size and mediastinal contours are within normal limits. Both lungs are clear. The visualized skeletal structures are unremarkable. IMPRESSION: No active cardiopulmonary disease. Electronically Signed   By: Mitzi Hansen M.D.   On: 01/06/2017 17:52     ____________________________________________   PROCEDURES  Critical Care performed: No   Procedure(s) performed:   Procedures   ____________________________________________   INITIAL IMPRESSION / ASSESSMENT AND PLAN / ED COURSE  As part of my medical decision making, I reviewed the following data within the electronic MEDICAL RECORD NUMBER Labs reviewed , EKG interpreted , Old chart reviewed, Radiograph reviewed  and Notes from prior ED visits    Differential diagnosis includes, but is not limited to, ACS, aortic dissection, pulmonary embolism, cardiac tamponade, pneumothorax, pneumonia, pericarditis, myocarditis, GI-related causes including esophagitis/gastritis, and musculoskeletal chest wall pain.    However the patient is very concerned almost to the point of perseveration about this chemical exposure that apparently occurred more than a month ago his prior records on the chart and verified his visit to [facility] a month ago, but I saw that there was no apparent mention of chemical exposure at that time from what I can read in careeverywhere.  The patient is still extremely concerned even after I went over the fact that all his vital signs are stable, his lab work is still normal, chest x-ray and EKG are normal.  He brought  up the possibility of heavy metal exposure.  He brought this up to Dr. Pershing ProudSchaevitz yesterday as well.  I explained that this is not a test that comes back soon and that there is no indication he was exposed to any heavy metals, but he is very fixated on this.  I agreed to send off the test but explained that it would take at least days if not more than a week for the results to come back.  I have made a note in [facility] to myself to follow-up on the labs but explained he would only be called if the tests showed abnormal findings.  He states he understands and agrees.  I am writing him a prescription for Zofran for his nausea.  He and his family member ask about his headache and  I encourage the use of over-the-counter ibuprofen and Tylenol.  No indication of any acute or emergent condition at this time.      ____________________________________________  FINAL CLINICAL IMPRESSION(S) / ED DIAGNOSES  Final diagnoses:  Nonintractable headache, unspecified chronicity pattern, unspecified headache type  Nausea     MEDICATIONS GIVEN DURING THIS VISIT:  Medications - No data to display   ED Discharge Orders        Ordered    ondansetron (ZOFRAN ODT) 4 MG disintegrating tablet     01/06/17 2028       Note:  This document was prepared using Dragon voice recognition software and may include unintentional dictation errors.    Loleta RoseForbach, Liseth Wann, MD 01/06/17 2030

## 2017-01-08 LAB — HEAVY METALS, BLOOD
ARSENIC: 4 ug/L (ref 2–23)
Lead: NOT DETECTED ug/dL (ref 0–4)
MERCURY: NOT DETECTED ug/L (ref 0.0–14.9)

## 2018-07-01 ENCOUNTER — Emergency Department: Payer: Self-pay

## 2018-07-01 ENCOUNTER — Emergency Department
Admission: EM | Admit: 2018-07-01 | Discharge: 2018-07-01 | Disposition: A | Discharge status: Home / Self Care | Payer: Self-pay | Attending: Emergency Medicine | Admitting: Emergency Medicine

## 2018-07-01 ENCOUNTER — Encounter: Payer: Self-pay | Admitting: Emergency Medicine

## 2018-07-01 ENCOUNTER — Other Ambulatory Visit: Payer: Self-pay

## 2018-07-01 DIAGNOSIS — U071 COVID-19: Secondary | ICD-10-CM

## 2018-07-01 DIAGNOSIS — R05 Cough: Secondary | ICD-10-CM

## 2018-07-01 DIAGNOSIS — R509 Fever, unspecified: Secondary | ICD-10-CM | POA: Insufficient documentation

## 2018-07-01 DIAGNOSIS — R059 Cough, unspecified: Secondary | ICD-10-CM

## 2018-07-01 MED ORDER — ACETAMINOPHEN 325 MG PO TABS
650.0000 mg | ORAL_TABLET | Freq: Once | ORAL | Status: AC
  Administered 2018-07-01: 650 mg via ORAL
  Filled 2018-07-01: qty 2

## 2018-07-01 MED ORDER — BENZONATATE 100 MG PO CAPS: 100.0000 mg | ORAL_CAPSULE | Freq: Four times a day (QID) | ORAL | 0 refills | Status: AC | PRN

## 2018-07-01 NOTE — ED Triage Notes (Signed)
Patient states he tested positive for Covid 19 3 days ago.  Patient states he is feeling worse, more short of breath and worsening cough and his pcp instructed him to come to the ED.  Patient ambulatory with no obvious distress.

## 2018-07-01 NOTE — ED Notes (Signed)
Xray at bedside. Po meds given for fever.

## 2018-07-01 NOTE — ED Notes (Signed)
Report was tested 3 days ago for covid and informed today he was positive. Reports positive cough x 4 days feeling ill with fever  and weak since last Friday. Sent to ed for further eval by pcp.

## 2018-07-01 NOTE — ED Notes (Addendum)
PATIENT AMBULATED ON CONTINUOS PULSE OX, SATING 98-97% DENIES SOB, REPORTS ONLY SOB WHEN COUGHING. AWAITING CHEST XRAY. MD AWARE OF STATUS.

## 2018-07-01 NOTE — Discharge Instructions (Signed)
Le recomiendo que compre una mquina de oximetra de pulso y si sus niveles alcanzan los 38 vuelven a la sala de [employer]. O, si se siente peor de alguna manera, incluida la falta de [place].

## 2018-07-01 NOTE — ED Provider Notes (Signed)
[facility] Emergency Department Provider Note  ____________________________________________   I have reviewed the triage vital signs and the nursing notes. Where available I have reviewed prior notes and, if possible and indicated, outside hospital notes.    HISTORY  Chief Complaint Cough and Fever    HPI Tony Dougherty is a 33 y.o. male who was diagnosed with coronavirus via testing 3 days ago, he has had a fever since about a week ago.  He is been eating and drinking well.  He is actually feels "pretty good" except for the fact that he has a persistent cough.  Patient seen and evaluated during the coronavirus epidemic during a time with low staffing he was sent in here for further evaluation because of the persistence of his cough.  Normally however coronavirus does last for several weeks.  Is nonproductive he does not feel dyspneic when he walks around he has no orthopnea no leg swelling, no nausea no vomiting, no other complaints and he is separating himself from family.  Past Medical History:  Diagnosis Date  . Headache     There are no active problems to display for this patient.   History reviewed. No pertinent surgical history.  Prior to Admission medications   Medication Sig Start Date End Date Taking? Authorizing Provider  naproxen (NAPROSYN) 500 MG tablet Take 1 tablet (500 mg total) by mouth 2 (two) times daily with a meal. Patient not taking: Reported on 01/06/2017 11/04/16   Sharman CheekStafford, Phillip, MD  ondansetron (ZOFRAN ODT) 4 MG disintegrating tablet Allow 1-2 tablets to dissolve in your mouth every 8 hours as needed for nausea/vomiting Patient not taking: Reported on 07/01/2018 01/06/17   Loleta RoseForbach, Cory, MD    Allergies Patient has no known allergies.  No family history on file.  Social History Social History   Tobacco Use  . Smoking status: Never Smoker  . Smokeless tobacco: Never Used  Substance Use Topics  . Alcohol  use: No  . Drug use: No    Review of Systems Constitutional: No fever Eyes: No visual changes. ENT: No sore throat. No stiff neck no neck pain Cardiovascular: Denies chest pain. Respiratory: Denies shortness of breath.  Positive cough Gastrointestinal:   no vomiting.  No diarrhea.  No constipation. Genitourinary: Negative for dysuria. Musculoskeletal: Negative lower extremity swelling Skin: Negative for rash. Neurological: Negative for severe headaches, focal weakness or numbness.   ____________________________________________   PHYSICAL EXAM:  VITAL SIGNS: ED Triage Vitals  Enc Vitals Group     BP 07/01/18 1447 132/79     Pulse Rate 07/01/18 1447 (!) 106     Resp 07/01/18 1447 18     Temp 07/01/18 1447 (!) 101.7 F (38.7 C)     Temp Source 07/01/18 1447 Oral     SpO2 07/01/18 1447 96 %     Weight 07/01/18 1450 250 lb (113.4 kg)     Height 07/01/18 1450 5\' 10"  (1.778 m)     Head Circumference --      Peak Flow --      Pain Score 07/01/18 1448 8     Pain Loc --      Pain Edu? --      Excl. in GC? --     Constitutional: Alert and oriented. Well appearing and in no acute distress. Eyes: Conjunctivae are normal Head: Atraumatic HEENT: No congestion/rhinnorhea. Mucous membranes are moist.  Oropharynx non-erythematous Neck:   Nontender with no meningismus, no masses, no stridor Cardiovascular:  Normal rate, regular rhythm. Grossly normal heart sounds.  Good peripheral circulation. Respiratory: Normal respiratory effort.  No retractions. Lungs CTAB. Abdominal: Soft and nontender. No distention. No guarding no rebound Back:  There is no focal tenderness or step off.  there is no midline tenderness there are no lesions noted. there is no CVA tenderness Musculoskeletal: No lower extremity tenderness, no upper extremity tenderness. No joint effusions, no DVT signs strong distal pulses no edema Neurologic:  Normal speech and language. No gross focal neurologic deficits are  appreciated.  Skin:  Skin is warm, dry and intact. No rash noted. Psychiatric: Mood and affect are normal. Speech and behavior are normal.  ____________________________________________   LABS (all labs ordered are listed, but only abnormal results are displayed)  Labs Reviewed - No data to display  Pertinent labs  results that were available during my care of the patient were reviewed by me and considered in my medical decision making (see chart for details). ____________________________________________  EKG  I personally interpreted any EKGs ordered by me or triage  ____________________________________________  RADIOLOGY  Pertinent labs & imaging results that were available during my care of the patient were reviewed by me and considered in my medical decision making (see chart for details). If possible, patient and/or family made aware of any abnormal findings.  Dg Chest Port 1 View  Result Date: 07/01/2018 CLINICAL DATA:  Patient states he tested positive for Covid 19 3 days ago. Patient states he is feeling worse, more short of breath and worsening cough and his pcp instructed him to come to the ED EXAM: PORTABLE CHEST 1 VIEW COMPARISON:  01/06/2017 FINDINGS: Small hazy area of airspace opacity noted in the right upper lobe. There is mild hazy opacity in the left mid and lower lung. Remainder of the lungs is clear. No pleural effusion or pneumothorax. Heart, mediastinum and hila are unremarkable. Skeletal structures are grossly intact. IMPRESSION: 1. Hazy bilateral infiltrates: Right upper lobe and mid to lower lung, consistent with pneumonia. Electronically Signed   By: Lajean Manes M.D.   On: 07/01/2018 16:34   ____________________________________________    PROCEDURES  Procedure(s) performed: None  Procedures  Critical Care performed: None  ____________________________________________   INITIAL IMPRESSION / ASSESSMENT AND PLAN / ED COURSE  Pertinent labs & imaging  results that were available during my care of the patient were reviewed by me and considered in my medical decision making (see chart for details).  Does have coronavirus has a chest x-ray consistent with coronavirus however even with ambulation on oxygen he does not desat below 95 he has no evidence of respiratory distress and there is no indication therefore for admission as there would be no intervention possible.  This is not a curable disease with no evidence of bacterial superinfection, extensive counseling given to the patient in Spanish about what to look out for.  Specifically hypoxia or dyspnea.  Neither which patient has.  I did do an x-ray just to get a baseline in case he gets back.  Patient is understanding the implications of these and the need for quarantine.  I have also advised that he buy a pulse oximeter at home.    ____________________________________________   FINAL CLINICAL IMPRESSION(S) / ED DIAGNOSES  Final diagnoses:  Cough      This chart was dictated using voice recognition software.  Despite best efforts to proofread,  errors can occur which can change meaning.      Schuyler Amor, MD 07/01/18 [phone]

## 2019-04-24 IMAGING — CR DG CHEST 2V
2 series · 2 of 2 positions shown · non-contrast
Comparison: None in PACs

CLINICAL DATA: Blurred vision, dizziness, and chest pain for the
past 2 hours with tight sensation in the stomach.

EXAM:
CHEST  2 VIEW

[chest pa]
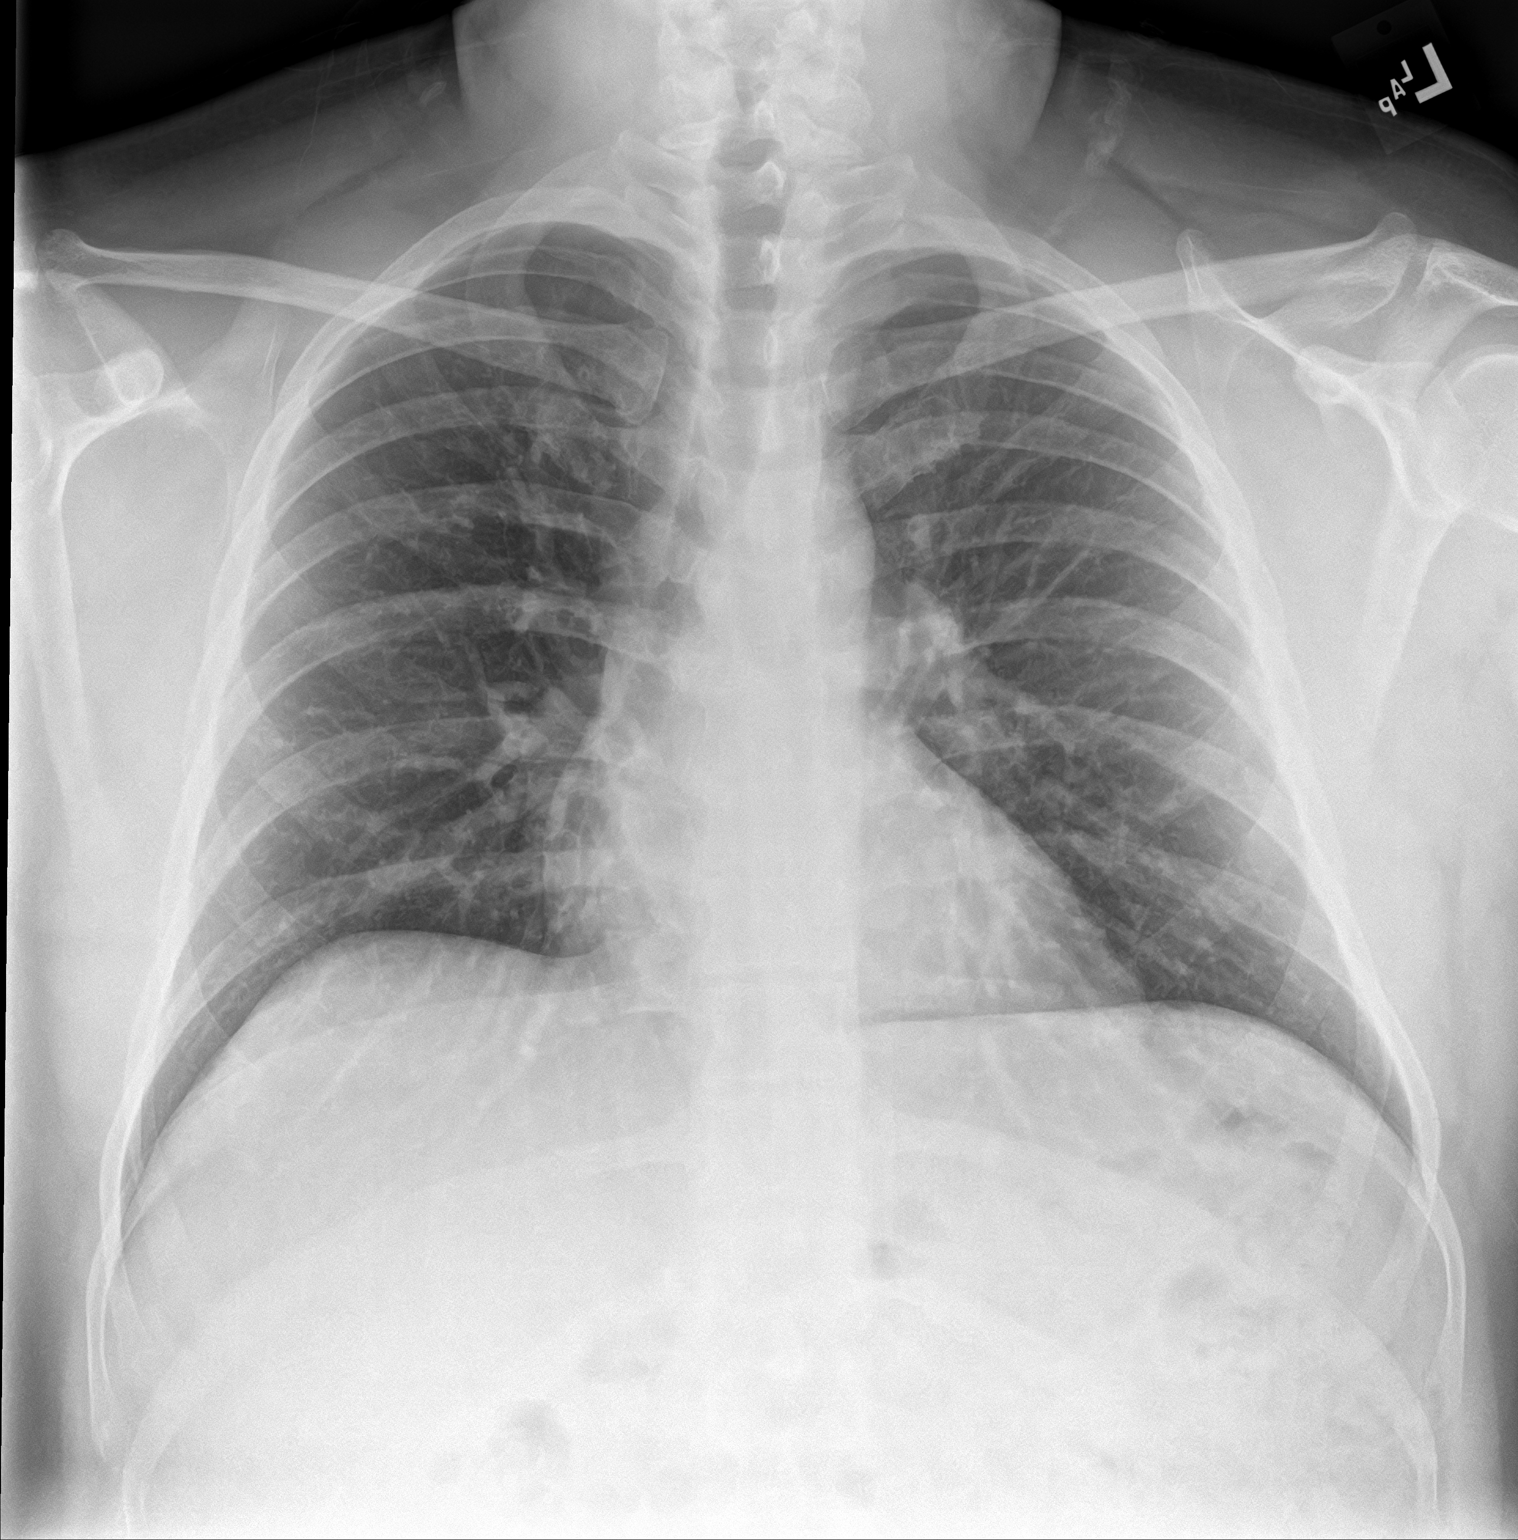

[chest lat]
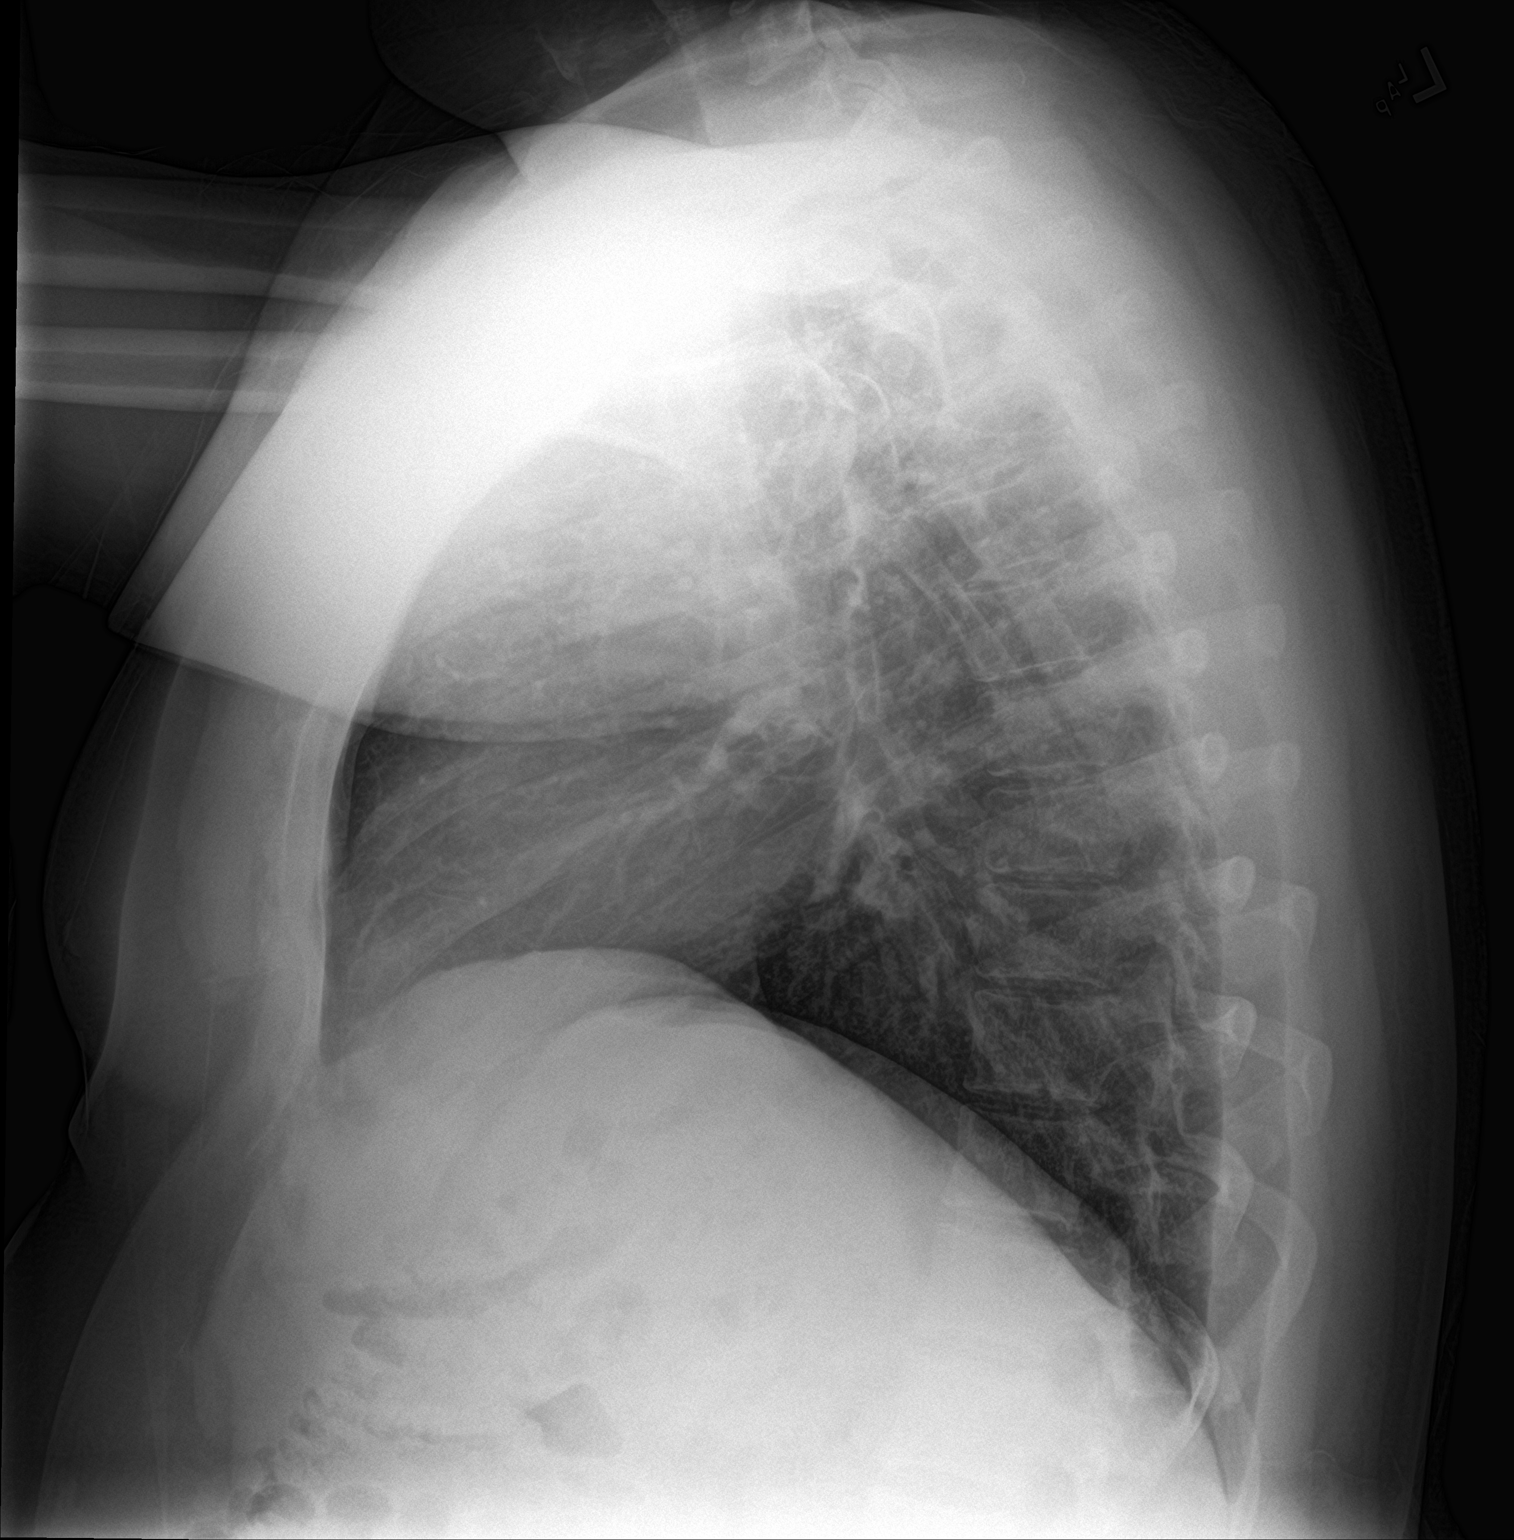

[2 of 2 positions shown; findings below may reference images not displayed]

FINDINGS: The lungs are adequately inflated and clear. The heart and pulmonary
vascularity are normal. The mediastinum is normal in width. There is
no pleural effusion. The bony thorax is unremarkable.
IMPRESSION: There is no active cardiopulmonary disease.

## 2020-12-18 IMAGING — DX PORTABLE CHEST - 1 VIEW
1 series · 1 of 1 positions shown · non-contrast
Comparison: 01/06/2017

CLINICAL DATA: Patient states he tested positive for Covid 19 3
days ago. Patient states he is feeling worse, more short of breath
and worsening cough and his pcp instructed him to come to the ED

EXAM:
PORTABLE CHEST 1 VIEW

[chest ap]
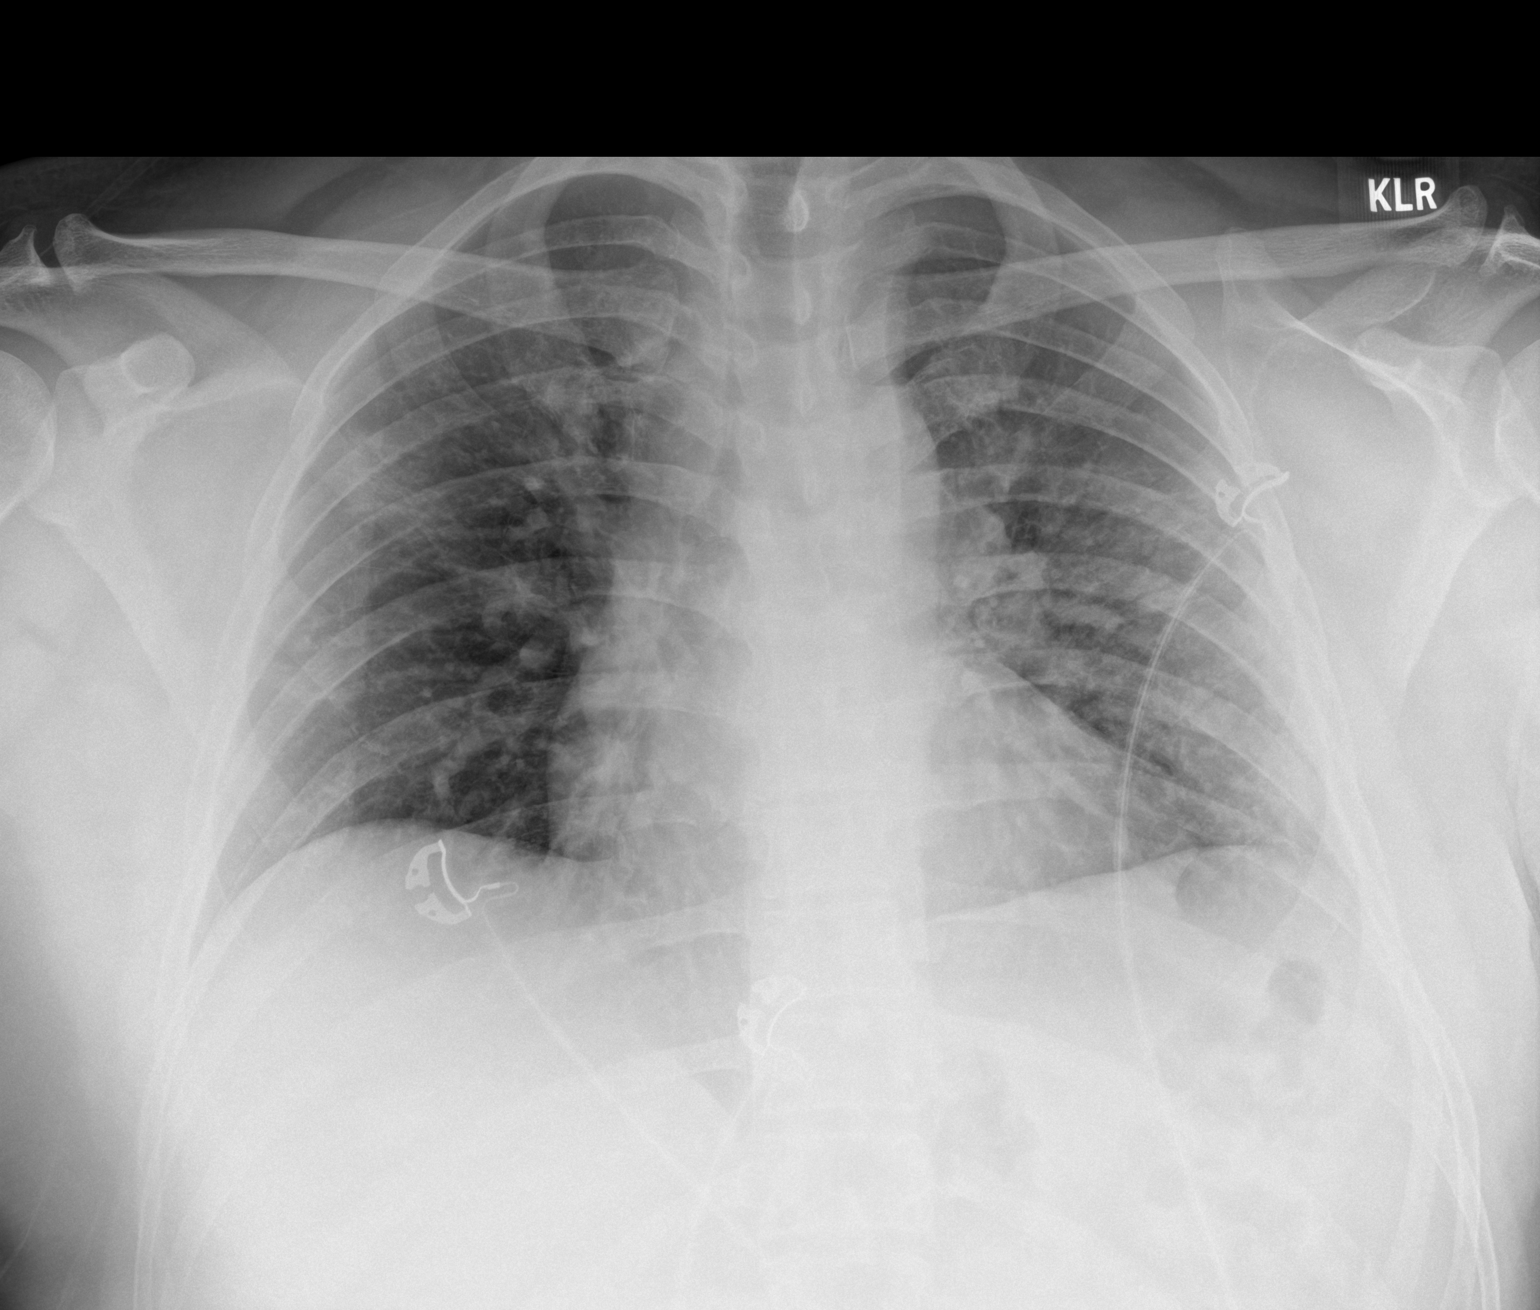

[1 of 1 positions shown; findings below may reference images not displayed]

FINDINGS: Small hazy area of airspace opacity noted in the right upper lobe.
There is mild hazy opacity in the left mid and lower lung. Remainder
of the lungs is clear. No pleural effusion or pneumothorax.

Heart, mediastinum and hila are unremarkable.

Skeletal structures are grossly intact.
IMPRESSION: 1. Hazy bilateral infiltrates: Right upper lobe and mid to lower
lung, consistent with pneumonia.
